# Patient Record
Sex: Female | Born: 1980 | Race: White | Hispanic: No | Marital: Single | Smoking: Never smoker
Health system: Southern US, Community
[De-identification: ages and names within clinical notes are randomized; demographics above are authoritative.]

---

## 2018-09-11 ENCOUNTER — Other Ambulatory Visit: Payer: Self-pay

## 2018-09-11 ENCOUNTER — Emergency Department
Admission: EM | Admit: 2018-09-11 | Discharge: 2018-09-11 | Disposition: A | Discharge status: Home / Self Care | Payer: Medicare PPO | Attending: Student in an Organized Health Care Education/Training Program | Admitting: Student in an Organized Health Care Education/Training Program

## 2018-09-11 ENCOUNTER — Emergency Department: Payer: Medicare PPO

## 2018-09-11 ENCOUNTER — Encounter: Payer: Self-pay | Admitting: Emergency Medicine

## 2018-09-11 DIAGNOSIS — N760 Acute vaginitis: Secondary | ICD-10-CM | POA: Insufficient documentation

## 2018-09-11 DIAGNOSIS — N739 Female pelvic inflammatory disease, unspecified: Secondary | ICD-10-CM | POA: Insufficient documentation

## 2018-09-11 DIAGNOSIS — K529 Noninfective gastroenteritis and colitis, unspecified: Secondary | ICD-10-CM | POA: Diagnosis not present

## 2018-09-11 DIAGNOSIS — N730 Acute parametritis and pelvic cellulitis: Secondary | ICD-10-CM

## 2018-09-11 DIAGNOSIS — E860 Dehydration: Secondary | ICD-10-CM | POA: Diagnosis not present

## 2018-09-11 DIAGNOSIS — B9689 Other specified bacterial agents as the cause of diseases classified elsewhere: Secondary | ICD-10-CM

## 2018-09-11 DIAGNOSIS — R102 Pelvic and perineal pain: Secondary | ICD-10-CM

## 2018-09-11 LAB — URINALYSIS, COMPLETE (UACMP) WITH MICROSCOPIC
Bacteria, UA: NONE SEEN
Bilirubin Urine: NEGATIVE
Glucose, UA: NEGATIVE mg/dL
Hgb urine dipstick: NEGATIVE
Ketones, ur: NEGATIVE mg/dL
LEUKOCYTE UA: NEGATIVE
Nitrite: NEGATIVE
Protein, ur: NEGATIVE mg/dL
Specific Gravity, Urine: 1.011 (ref 1.005–1.030)
pH: 7 (ref 5.0–8.0)

## 2018-09-11 LAB — CBC WITH DIFFERENTIAL/PLATELET
Abs Immature Granulocytes: 0.02 10*3/uL (ref 0.00–0.07)
Basophils Absolute: 0 10*3/uL (ref 0.0–0.1)
Basophils Relative: 0 %
EOS PCT: 1 %
Eosinophils Absolute: 0.1 10*3/uL (ref 0.0–0.5)
HCT: 42.4 % (ref 36.0–46.0)
Hemoglobin: 13.8 g/dL (ref 12.0–15.0)
Immature Granulocytes: 0 %
Lymphocytes Relative: 10 %
Lymphs Abs: 0.9 10*3/uL (ref 0.7–4.0)
MCH: 31.1 pg (ref 26.0–34.0)
MCHC: 32.5 g/dL (ref 30.0–36.0)
MCV: 95.5 fL (ref 80.0–100.0)
Monocytes Absolute: 0.4 10*3/uL (ref 0.1–1.0)
Monocytes Relative: 5 %
Neutro Abs: 6.9 10*3/uL (ref 1.7–7.7)
Neutrophils Relative %: 84 %
Platelets: 347 10*3/uL (ref 150–400)
RBC: 4.44 MIL/uL (ref 3.87–5.11)
RDW: 12.2 % (ref 11.5–15.5)
WBC: 8.3 10*3/uL (ref 4.0–10.5)
nRBC: 0 % (ref 0.0–0.2)

## 2018-09-11 LAB — WET PREP, GENITAL
Sperm: NONE SEEN
Trich, Wet Prep: NONE SEEN
Yeast Wet Prep HPF POC: NONE SEEN

## 2018-09-11 LAB — COMPREHENSIVE METABOLIC PANEL
ALT: 22 U/L (ref 0–44)
ANION GAP: 8 (ref 5–15)
AST: 21 U/L (ref 15–41)
Albumin: 4.1 g/dL (ref 3.5–5.0)
Alkaline Phosphatase: 44 U/L (ref 38–126)
BUN: 5 mg/dL — ABNORMAL LOW (ref 6–20)
CO2: 25 mmol/L (ref 22–32)
Calcium: 8.5 mg/dL — ABNORMAL LOW (ref 8.9–10.3)
Chloride: 103 mmol/L (ref 98–111)
Creatinine, Ser: 0.57 mg/dL (ref 0.44–1.00)
GFR calc Af Amer: >60 mL/min (ref >60)
GFR calc non Af Amer: >60 mL/min (ref >60)
Glucose, Bld: 134 mg/dL — ABNORMAL HIGH (ref 70–99)
Potassium: 3.5 mmol/L (ref 3.5–5.1)
Sodium: 136 mmol/L (ref 135–145)
Total Bilirubin: 0.4 mg/dL (ref 0.3–1.2)
Total Protein: 7.3 g/dL (ref 6.5–8.1)

## 2018-09-11 LAB — CHLAMYDIA/NGC RT PCR (ARMC ONLY)
Chlamydia Tr: NOT DETECTED
N gonorrhoeae: NOT DETECTED

## 2018-09-11 LAB — LACTIC ACID, PLASMA: LACTIC ACID, VENOUS: 1.3 mmol/L (ref 0.5–1.9)

## 2018-09-11 MED ORDER — IOHEXOL 300 MG/ML  SOLN
100.0000 mL | Freq: Once | INTRAMUSCULAR | Status: AC | PRN
  Administered 2018-09-11: 100 mL via INTRAVENOUS
  Filled 2018-09-11: qty 100

## 2018-09-11 MED ORDER — SODIUM CHLORIDE 0.9 % IV BOLUS
1000.0000 mL | Freq: Once | INTRAVENOUS | Status: AC
  Administered 2018-09-11: 1000 mL via INTRAVENOUS

## 2018-09-11 MED ORDER — PROMETHAZINE HCL 25 MG/ML IJ SOLN
25.0000 mg | Freq: Once | INTRAMUSCULAR | Status: AC
  Administered 2018-09-11: 25 mg via INTRAVENOUS
  Filled 2018-09-11: qty 1

## 2018-09-11 MED ORDER — DOXYCYCLINE HYCLATE 100 MG PO TABS: 100.0000 mg | ORAL_TABLET | Freq: Two times a day (BID) | ORAL | 0 refills | Status: AC

## 2018-09-11 MED ORDER — LIDOCAINE HCL (PF) 1 % IJ SOLN
2.1000 mL | Freq: Once | INTRAMUSCULAR | Status: AC
  Administered 2018-09-11: 2.1 mL
  Filled 2018-09-11: qty 5

## 2018-09-11 MED ORDER — METRONIDAZOLE 500 MG PO TABS: 500.0000 mg | ORAL_TABLET | Freq: Two times a day (BID) | ORAL | 0 refills | Status: AC

## 2018-09-11 MED ORDER — CEFTRIAXONE SODIUM 1 G IJ SOLR
1.0000 g | Freq: Once | INTRAMUSCULAR | Status: AC
  Administered 2018-09-11: 1 g via INTRAMUSCULAR
  Filled 2018-09-11: qty 10

## 2018-09-11 MED ORDER — IOPAMIDOL (ISOVUE-300) INJECTION 61%
30.0000 mL | Freq: Once | INTRAVENOUS | Status: AC | PRN
  Administered 2018-09-11: 30 mL via ORAL
  Filled 2018-09-11: qty 30

## 2018-09-11 MED ORDER — ONDANSETRON 4 MG PO TBDP: 4.0000 mg | ORAL_TABLET | Freq: Three times a day (TID) | ORAL | 0 refills | Status: DC | PRN

## 2018-09-11 NOTE — ED Notes (Signed)
Peripheral IV discontinued. Catheter intact. No signs of infiltration or redness. Gauze applied to IV site.   Discharge instructions reviewed with patient. Questions fielded by this RN. Patient verbalizes understanding of instructions. Patient discharged home in stable condition per JC, PA. No acute distress noted at time of discharge.

## 2018-09-11 NOTE — ED Provider Notes (Signed)
[facility] Emergency Department Provider Note  ____________________________________________  Time seen: Approximately 6:23 PM  I have reviewed the triage vital signs and the nursing notes.   HISTORY  Chief Complaint Foreign Body in Vagina    HPI Doris Coleman is a 38 y.o. female who presents the emergency department for complaint sharp pelvic pain, fever, diarrhea.  Patient presented to the emergency department with complaints of excruciating vaginal and pelvic pain.  Patient had a NuvaRing placed 5 days ago and states that since then she has had worsening symptoms.  This is not due for removal but states patient states that she needs it removed.  She attempted removal at home and states that she could not find same.  Patient denies any vaginal bleeding or discharge.  She denies any dysuria, polyuria, hematuria.  No flank pain.  No upper abdominal pain.  No right lower quadrant or left lower quadrant she describes it as a pelvic/vaginal pain.  No history of STDs or PID.  Patient has been taking both Tylenol and Motrin for this complaint.        History reviewed. No pertinent past medical history.  There are no active problems to display for this patient.   History reviewed. No pertinent surgical history.  Prior to Admission medications   Medication Sig Start Date End Date Taking? Authorizing Provider  doxycycline (VIBRA-TABS) 100 MG tablet Take 1 tablet (100 mg total) by mouth 2 (two) times daily for 14 days. 09/11/18 09/25/18  Cuthriell, Delorise Royals, PA-C  metroNIDAZOLE (FLAGYL) 500 MG tablet Take 1 tablet (500 mg total) by mouth 2 (two) times daily for 14 days. 09/11/18 09/25/18  Cuthriell, Delorise Royals, PA-C  ondansetron (ZOFRAN-ODT) 4 MG disintegrating tablet Take 1 tablet (4 mg total) by mouth every 8 (eight) hours as needed for nausea or vomiting. 09/11/18   Cuthriell, Delorise Royals, PA-C    Allergies Patient has no known allergies.  History reviewed. No  pertinent family history.  Social History Social History   Tobacco Use  . Smoking status: Never Smoker  . Smokeless tobacco: Never Used  Substance Use Topics  . Alcohol use: Never    Frequency: Never  . Drug use: Never     Review of Systems  Constitutional: No fever/chills Eyes: No visual changes.  Cardiovascular: no chest pain. Respiratory: no cough. No SOB. Gastrointestinal: Sharp pelvic and vaginal pain.  No nausea, no vomiting.  No diarrhea.  No constipation. Genitourinary: Negative for dysuria. No hematuria.  Positive for retained foreign body in the vagina.  Positive for vaginal pain.  No discharge or vaginal bleeding. Musculoskeletal: Negative for musculoskeletal pain. Skin: Negative for rash, abrasions, lacerations, ecchymosis. Neurological: Negative for headaches, focal weakness or numbness. 10-point ROS otherwise negative.  ____________________________________________   PHYSICAL EXAM:  VITAL SIGNS: ED Triage Vitals  Enc Vitals Group     BP 09/11/18 1813 138/84     Pulse Rate 09/11/18 1811 92     Resp 09/11/18 1811 18     Temp 09/11/18 1811 98.3 F (36.8 C)     Temp Source 09/11/18 1811 Oral     SpO2 09/11/18 1811 98 %     Weight 09/11/18 1812 215 lb (97.5 kg)     Height 09/11/18 1812  (1.651 m)     Head Circumference --      Peak Flow --      Pain Score 09/11/18 1812 0     Pain Loc --      Pain  Edu? --      Excl. in GC? --      Constitutional: Alert and oriented. Well appearing and in no acute distress. Eyes: Conjunctivae are normal. PERRL. EOMI. Head: Atraumatic. Neck: No stridor.    Cardiovascular: Normal rate, regular rhythm. Normal S1 and S2.  Good peripheral circulation. Respiratory: Normal respiratory effort without tachypnea or retractions. Lungs CTAB. Good air entry to the bases with no decreased or absent breath sounds. Gastrointestinal: Bowel sounds 4 quadrants. Soft and nontender to palpation. No guarding or rigidity. No palpable  masses. No distention. No CVA tenderness. Genitourinary: Visualization of external genitalia reveals no visible abnormality.  Patient was barely able to tolerate speculum exam due to pain.  No lesions or tears noted along the vaginal wall.  Patient was unable to tolerate opening of the speculum to the first notch.  Cervix was identified and it was erythematous, edematous.  No evidence of Nuvaring on initial exam.  Using bimanual exam, patient has significant cervical motion tenderness.  Palpation of the NuvaRing was appreciated and this was successfully removed.  No palpable masses or lesions on bimanual exam. Musculoskeletal: Full range of motion to all extremities. No gross deformities appreciated. Neurologic:  Normal speech and language. No gross focal neurologic deficits are appreciated.  Skin:  Skin is warm, dry and intact. No rash noted. Psychiatric: Mood and affect are normal. Speech and behavior are normal. Patient exhibits appropriate insight and judgement.  Female pelvic exam is performed with female ED tech chaperone. ____________________________________________   LABS (all labs ordered are listed, but only abnormal results are displayed)  Labs Reviewed  WET PREP, GENITAL - Abnormal; Notable for the following components:      Result Value   Clue Cells Wet Prep HPF POC PRESENT (*)    WBC, Wet Prep HPF POC MANY (*)    All other components within normal limits  COMPREHENSIVE METABOLIC PANEL - Abnormal; Notable for the following components:   Glucose, Bld 134 (*)    BUN 5 (*)    Calcium 8.5 (*)    All other components within normal limits  CHLAMYDIA/NGC RT PCR ([facility] ONLY)  CULTURE, BLOOD (ROUTINE X 2)  CULTURE, BLOOD (ROUTINE X 2)  CBC WITH DIFFERENTIAL/PLATELET  LACTIC ACID, PLASMA  URINALYSIS, COMPLETE (UACMP) WITH MICROSCOPIC  LACTIC ACID, PLASMA  POC URINE PREG, ED    ____________________________________________  EKG   ____________________________________________  RADIOLOGY   Ct Abdomen Pelvis W Contrast  Result Date: 09/11/2018 CLINICAL DATA:  Diffuse, nonfocal abdominal pain. Pelvic pain, fever and diarrhea. Nausea and vomiting. EXAM: CT ABDOMEN AND PELVIS WITH CONTRAST TECHNIQUE: Multidetector CT imaging of the abdomen and pelvis was performed using the standard protocol following bolus administration of intravenous contrast. CONTRAST:  OMNIPAQUE IOHEXOL 300 MG/ML  SOLN COMPARISON:  None. FINDINGS: Lower chest: Unremarkable. Hepatobiliary: Diffuse low density of the liver relative to the spleen. Normal appearing gallbladder. Pancreas: Unremarkable. No pancreatic ductal dilatation or surrounding inflammatory changes. Spleen: Normal in size without focal abnormality. Adrenals/Urinary Tract: Adrenal glands are unremarkable. Kidneys are normal, without renal calculi, focal lesion, or hydronephrosis. Bladder is unremarkable. Stomach/Bowel: Stomach is within normal limits. Appendix appears normal. Mild diffuse wall thickening involving the majority of the small bowel loops. Normal appearing colon. Vascular/Lymphatic: No significant vascular findings are present. No enlarged abdominal or pelvic lymph nodes. Reproductive: Uterus and bilateral adnexa are unremarkable. Other: Small umbilical hernia containing fat. Musculoskeletal: Mild lumbar and lower thoracic spine degenerative changes. IMPRESSION: 1. Mild diffuse wall thickening involving  the majority of the small bowel loops, compatible with enteritis. 2. Diffuse hepatic steatosis. Electronically Signed   By: Beckie Salts M.D.   On: 09/11/2018 21:00    ____________________________________________    PROCEDURES  Procedure(s) performed:    Procedures    Medications  cefTRIAXone (ROCEPHIN) injection 1 g (has no administration in time range)  lidocaine (PF) (XYLOCAINE) 1 % injection 2.1 mL (has no  administration in time range)  sodium chloride 0.9 % bolus 1,000 mL (1,000 mLs Intravenous Bolus from Bag 09/11/18 1909)  promethazine (PHENERGAN) injection 25 mg (25 mg Intravenous Given 09/11/18 1933)  iopamidol (ISOVUE-300) 61 % injection 30 mL (30 mLs Oral Contrast Given 09/11/18 1922)  iohexol (OMNIPAQUE) 300 MG/ML solution 100 mL (100 mLs Intravenous Contrast Given 09/11/18 2044)     ____________________________________________   INITIAL IMPRESSION / ASSESSMENT AND PLAN / ED COURSE  Pertinent labs & imaging results that were available during my care of the patient were reviewed by me and considered in my medical decision making (see chart for details).  Review of the [place] CSRS was performed in accordance of the [facility] prior to dispensing any controlled drugs.           Patient's diagnosis is consistent with pelvic inflammatory disease, BV, enteritis.  Patient presents emergency department with multiple complaints.  Initially patient's main complaint was significant pelvic pain, retained NuvaRing.  While she was here, patient endorsed increasing abdominal symptoms.  She has had diarrhea at home and is now experiencing nausea and vomiting.  Given patient's symptoms she was evaluated with labs, imaging.  Patient does have findings consistent with BV on wet prep, otherwise labs are reassuring.  Patient does have findings consistent with enteritis on CT.  With significant vaginal tenderness on exam and cervical motion tenderness.  Patient will be treated with Rocephin in the emergency department, doxycycline and Flagyl outpatient.  Patient is provided prescription for Zofran for nausea and vomiting.  I discussed the use of probiotics for both the enteritis as well as to limit potential side effects of antibiotic use.  Patient verbalizes understanding of her diagnosis.  She will follow-up with OB/GYN..  Patient is given ED precautions to return to the ED for any worsening or new  symptoms.     ____________________________________________  FINAL CLINICAL IMPRESSION(S) / ED DIAGNOSES  Final diagnoses:  Pelvic pain  PID (acute pelvic inflammatory disease)  BV (bacterial vaginosis)  Enteritis      NEW MEDICATIONS STARTED DURING THIS VISIT:  ED Discharge Orders         Ordered    metroNIDAZOLE (FLAGYL) 500 MG tablet  2 times daily     09/11/18 2126    doxycycline (VIBRA-TABS) 100 MG tablet  2 times daily     09/11/18 2126    ondansetron (ZOFRAN-ODT) 4 MG disintegrating tablet  Every 8 hours PRN     09/11/18 2126              This chart was dictated using voice recognition software/Dragon. Despite best efforts to proofread, errors can occur which can change the meaning. Any change was purely unintentional.    Racheal Patches, PA-C 09/11/18 2148    Willy Eddy, MD 09/12/18 1515

## 2018-09-11 NOTE — ED Notes (Signed)
Patient transported to CT 

## 2018-09-11 NOTE — ED Notes (Signed)
CT at bedside with contrast drink

## 2018-09-11 NOTE — ED Triage Notes (Addendum)
Pt here because cannot get her nuvaring out.  Has been in since the 10th of this month and not tolerating but cannot get it out.  Does not have OBGYN here.  Sister is registered [employer] per pt mom and told her to come to ED because can get toxic shock. Explained nuva ring is made to be in for 3 weeks so toxic shock is not something would see typically after it being in for 5 days.  VSS. Pt ambulatory. Would like help getting it out.

## 2018-09-11 NOTE — ED Notes (Signed)
Patient reports she was here to get nuva ring taken out that was put in on 09/06/2018

## 2018-09-11 NOTE — ED Notes (Signed)
Patient has finished both bottles of oral contrast. CT tech aware.

## 2018-09-16 LAB — CULTURE, BLOOD (ROUTINE X 2)
Culture: NO GROWTH
Culture: NO GROWTH
Special Requests: ADEQUATE

## 2020-02-27 IMAGING — CT CT ABDOMEN AND PELVIS WITH CONTRAST
2 of 4 series · 16 of 46 positions shown, 18 images · IV contrast (APPLIED)
Comparison: None.

CLINICAL DATA: Diffuse, nonfocal abdominal pain. Pelvic pain, fever
and diarrhea. Nausea and vomiting.

EXAM:
CT ABDOMEN AND PELVIS WITH CONTRAST
TECHNIQUE: Multidetector CT imaging of the abdomen and pelvis was performed
using the standard protocol following bolus administration of
intravenous contrast.
CONTRAST:  100mL OMNIPAQUE IOHEXOL 300 MG/ML  SOLN

[Series 2: routine abd/pel with · axial · 0.72mm/px · z∈[-368,+97]mm · 13 of 103 slices shown, 15 images]
[im 5/103  soft-tissue]
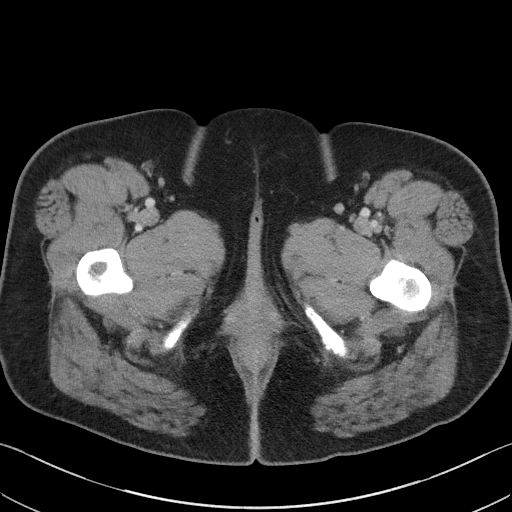
[im 5/103  bone]
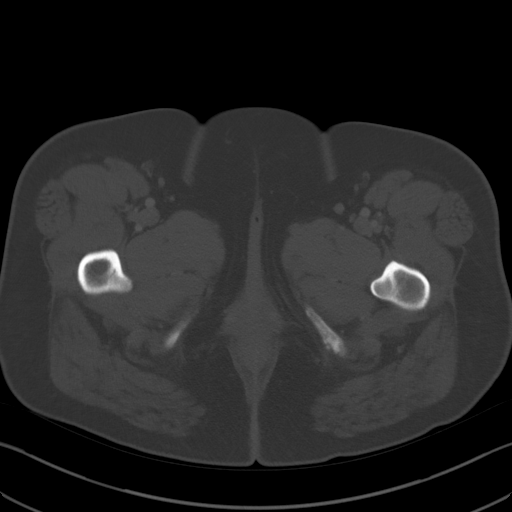
[im 14/103  soft-tissue]
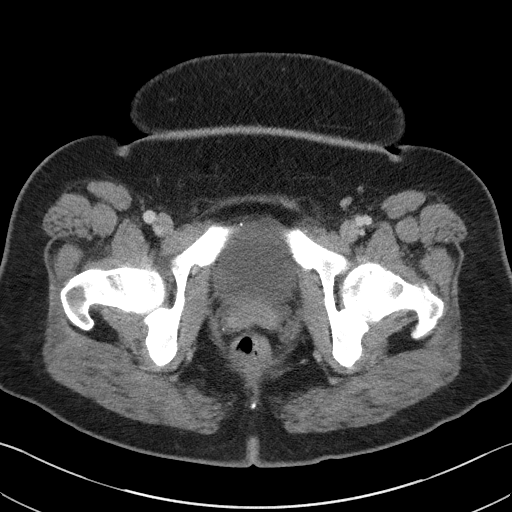
[im 23/103  soft-tissue]
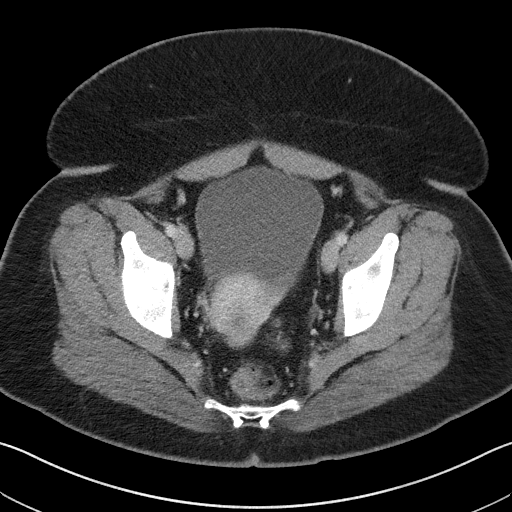
[im 27/103  soft-tissue]
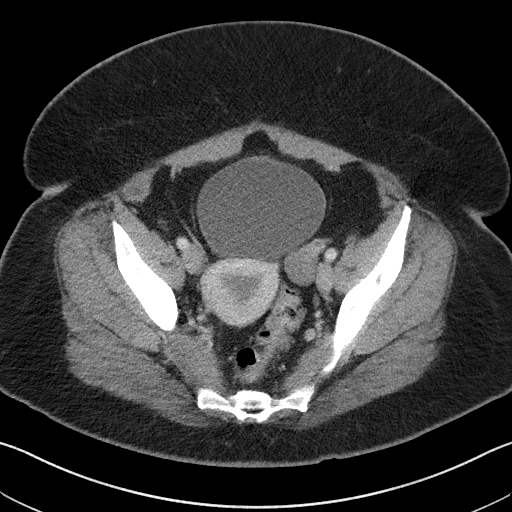
[im 36/103  soft-tissue]
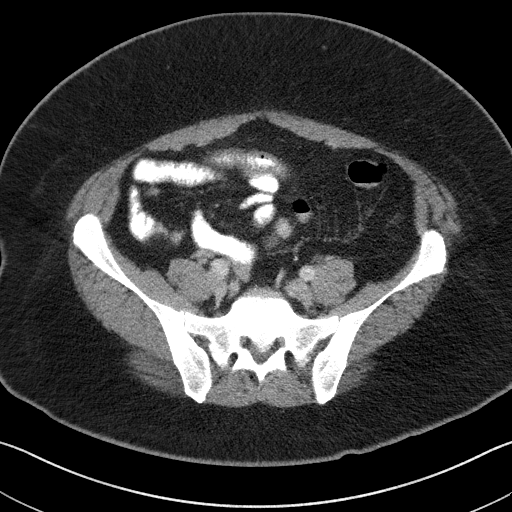
[im 45/103  soft-tissue]
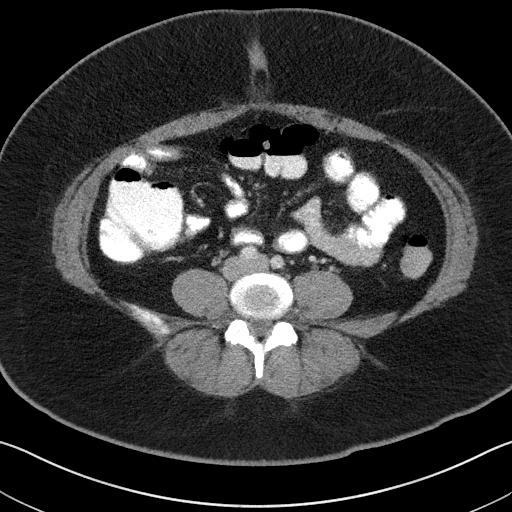
[im 54/103  soft-tissue]
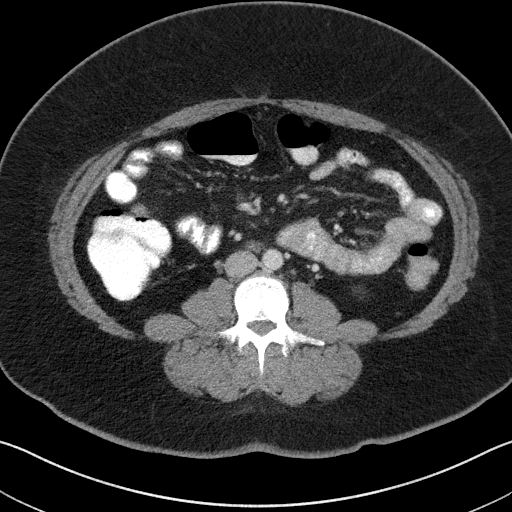
[im 58/103  soft-tissue]
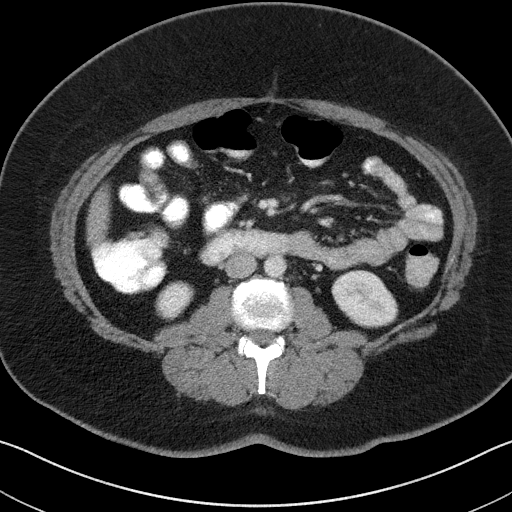
[im 67/103  soft-tissue]
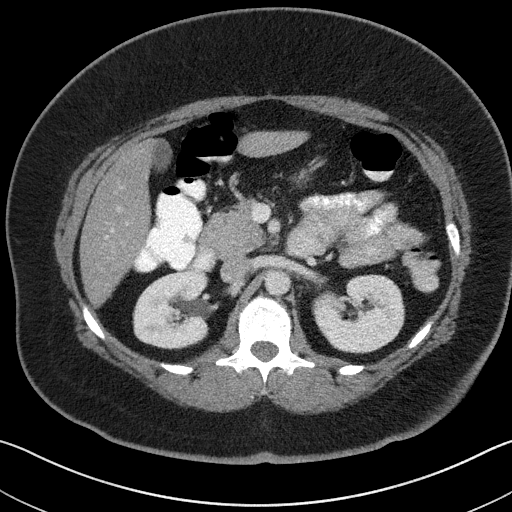
[im 67/103  bone]
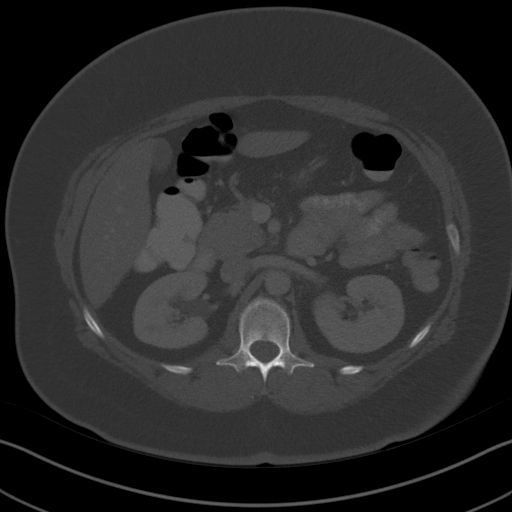
[im 76/103  soft-tissue]
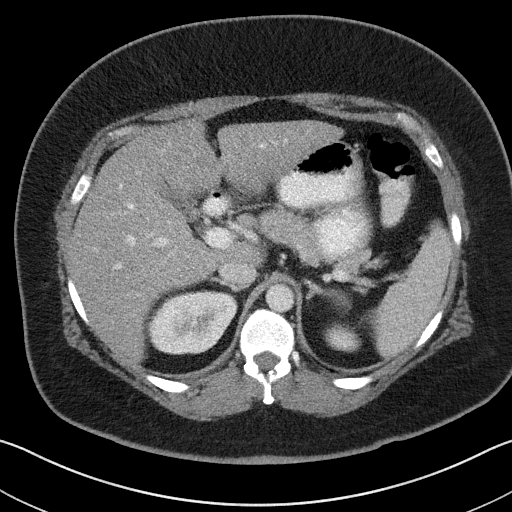
[im 80/103  soft-tissue]
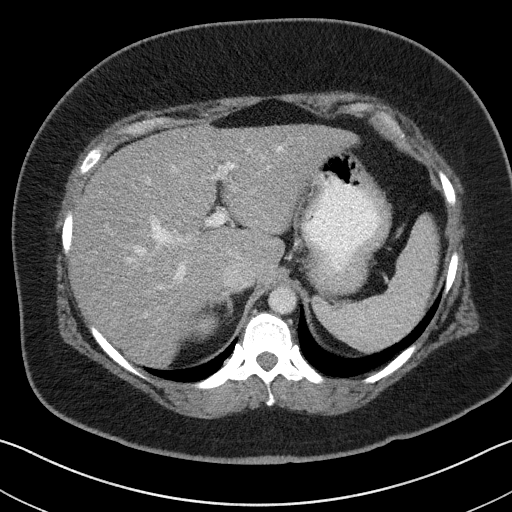
[im 89/103  soft-tissue]
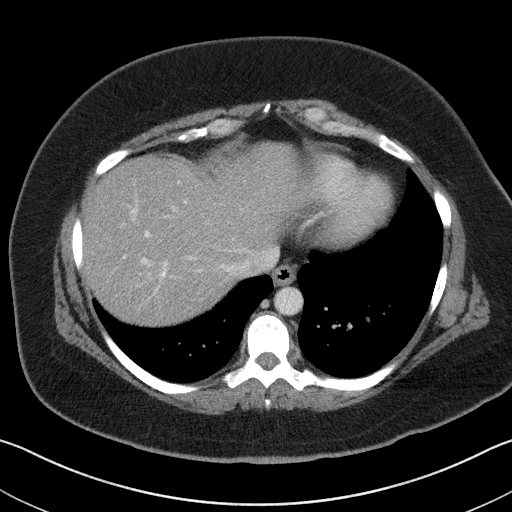
[im 98/103  soft-tissue]
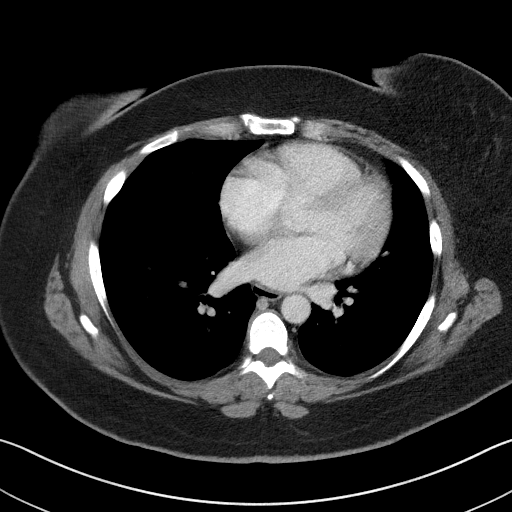

[Series 5: coronal st · coronal · 0.81mm/px · 3 of 109 slices shown]
[im 37/109  soft-tissue]
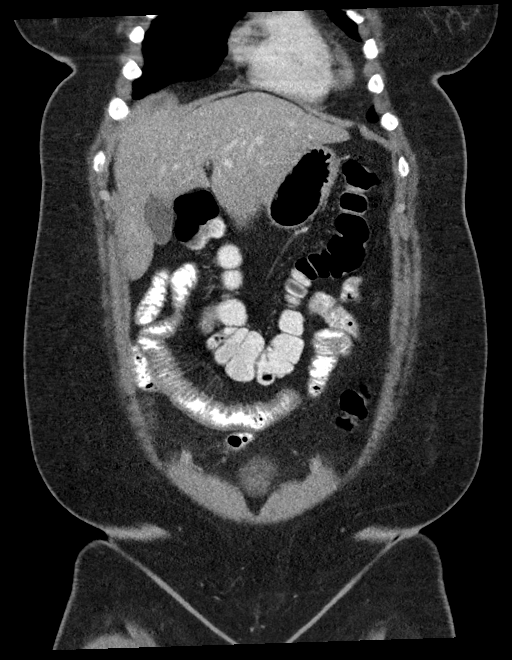
[im 49/109  soft-tissue]
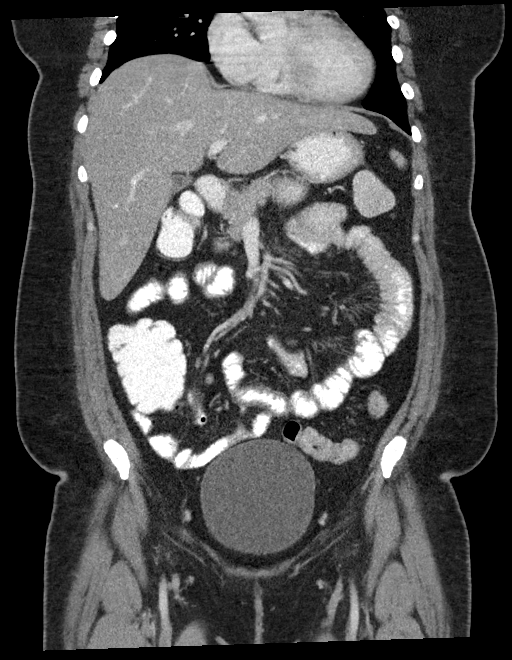
[im 61/109  soft-tissue]
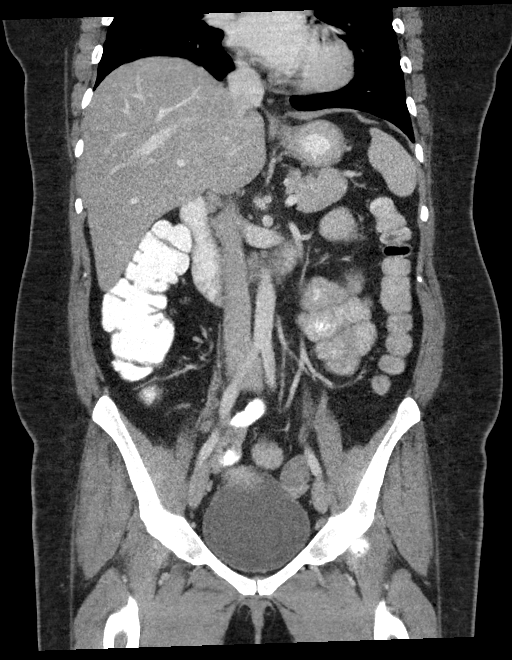

[16 of 46 positions shown; findings below may reference images not displayed]

FINDINGS: Lower chest: Unremarkable.

Hepatobiliary: Diffuse low density of the liver relative to the
spleen. Normal appearing gallbladder.

Pancreas: Unremarkable. No pancreatic ductal dilatation or
surrounding inflammatory changes.

Spleen: Normal in size without focal abnormality.

Adrenals/Urinary Tract: Adrenal glands are unremarkable. Kidneys are
normal, without renal calculi, focal lesion, or hydronephrosis.
Bladder is unremarkable.

Stomach/Bowel: Stomach is within normal limits. Appendix appears
normal. Mild diffuse wall thickening involving the majority of the
small bowel loops. Normal appearing colon.

Vascular/Lymphatic: No significant vascular findings are present. No
enlarged abdominal or pelvic lymph nodes.

Reproductive: Uterus and bilateral adnexa are unremarkable.

Other: Small umbilical hernia containing fat.

Musculoskeletal: Mild lumbar and lower thoracic spine degenerative
changes.
IMPRESSION: 1. Mild diffuse wall thickening involving the majority of the small
bowel loops, compatible with enteritis.
2. Diffuse hepatic steatosis.

## 2020-10-21 DIAGNOSIS — M62830 Muscle spasm of back: Secondary | ICD-10-CM | POA: Diagnosis not present

## 2020-10-24 DIAGNOSIS — M545 Low back pain, unspecified: Secondary | ICD-10-CM | POA: Insufficient documentation

## 2020-11-18 DIAGNOSIS — M545 Low back pain, unspecified: Secondary | ICD-10-CM | POA: Diagnosis not present

## 2020-11-18 DIAGNOSIS — M5412 Radiculopathy, cervical region: Secondary | ICD-10-CM | POA: Diagnosis not present

## 2020-11-22 DIAGNOSIS — M545 Low back pain, unspecified: Secondary | ICD-10-CM | POA: Diagnosis not present

## 2020-11-25 DIAGNOSIS — M5412 Radiculopathy, cervical region: Secondary | ICD-10-CM | POA: Diagnosis not present

## 2020-12-05 DIAGNOSIS — M545 Low back pain, unspecified: Secondary | ICD-10-CM | POA: Diagnosis not present

## 2020-12-20 DIAGNOSIS — M5030 Other cervical disc degeneration, unspecified cervical region: Secondary | ICD-10-CM | POA: Diagnosis not present

## 2021-06-16 DIAGNOSIS — Z20822 Contact with and (suspected) exposure to covid-19: Secondary | ICD-10-CM | POA: Diagnosis not present

## 2021-06-16 DIAGNOSIS — J069 Acute upper respiratory infection, unspecified: Secondary | ICD-10-CM | POA: Diagnosis not present

## 2021-06-17 DIAGNOSIS — J069 Acute upper respiratory infection, unspecified: Secondary | ICD-10-CM | POA: Diagnosis not present

## 2021-06-17 DIAGNOSIS — R69 Illness, unspecified: Secondary | ICD-10-CM | POA: Diagnosis not present

## 2021-11-24 DIAGNOSIS — R5383 Other fatigue: Secondary | ICD-10-CM | POA: Diagnosis not present

## 2021-11-24 DIAGNOSIS — R69 Illness, unspecified: Secondary | ICD-10-CM | POA: Diagnosis not present

## 2021-11-24 DIAGNOSIS — R7303 Prediabetes: Secondary | ICD-10-CM | POA: Diagnosis not present

## 2021-11-24 DIAGNOSIS — E559 Vitamin D deficiency, unspecified: Secondary | ICD-10-CM | POA: Diagnosis not present

## 2021-11-24 DIAGNOSIS — E782 Mixed hyperlipidemia: Secondary | ICD-10-CM | POA: Diagnosis not present

## 2021-11-24 DIAGNOSIS — N911 Secondary amenorrhea: Secondary | ICD-10-CM | POA: Diagnosis not present

## 2022-01-09 DIAGNOSIS — R69 Illness, unspecified: Secondary | ICD-10-CM | POA: Diagnosis not present

## 2022-01-09 DIAGNOSIS — E559 Vitamin D deficiency, unspecified: Secondary | ICD-10-CM | POA: Diagnosis not present

## 2022-01-09 DIAGNOSIS — R5383 Other fatigue: Secondary | ICD-10-CM | POA: Diagnosis not present

## 2022-01-09 DIAGNOSIS — E782 Mixed hyperlipidemia: Secondary | ICD-10-CM | POA: Diagnosis not present

## 2022-01-09 DIAGNOSIS — R7303 Prediabetes: Secondary | ICD-10-CM | POA: Diagnosis not present

## 2022-01-09 DIAGNOSIS — N911 Secondary amenorrhea: Secondary | ICD-10-CM | POA: Diagnosis not present

## 2022-01-09 DIAGNOSIS — F32A Depression, unspecified: Secondary | ICD-10-CM | POA: Diagnosis not present

## 2022-01-23 DIAGNOSIS — E1165 Type 2 diabetes mellitus with hyperglycemia: Secondary | ICD-10-CM | POA: Diagnosis not present

## 2022-01-23 DIAGNOSIS — I10 Essential (primary) hypertension: Secondary | ICD-10-CM | POA: Diagnosis not present

## 2022-01-23 DIAGNOSIS — E782 Mixed hyperlipidemia: Secondary | ICD-10-CM | POA: Diagnosis not present

## 2022-01-23 DIAGNOSIS — R69 Illness, unspecified: Secondary | ICD-10-CM | POA: Diagnosis not present

## 2022-02-23 DIAGNOSIS — E1165 Type 2 diabetes mellitus with hyperglycemia: Secondary | ICD-10-CM | POA: Diagnosis not present

## 2022-02-23 DIAGNOSIS — R69 Illness, unspecified: Secondary | ICD-10-CM | POA: Diagnosis not present

## 2022-02-23 DIAGNOSIS — I10 Essential (primary) hypertension: Secondary | ICD-10-CM | POA: Diagnosis not present

## 2022-02-23 DIAGNOSIS — E782 Mixed hyperlipidemia: Secondary | ICD-10-CM | POA: Diagnosis not present

## 2022-03-02 DIAGNOSIS — S29012A Strain of muscle and tendon of back wall of thorax, initial encounter: Secondary | ICD-10-CM | POA: Diagnosis not present

## 2022-03-02 DIAGNOSIS — M549 Dorsalgia, unspecified: Secondary | ICD-10-CM | POA: Diagnosis not present

## 2022-04-24 DIAGNOSIS — Z0131 Encounter for examination of blood pressure with abnormal findings: Secondary | ICD-10-CM | POA: Diagnosis not present

## 2022-04-24 DIAGNOSIS — E1165 Type 2 diabetes mellitus with hyperglycemia: Secondary | ICD-10-CM | POA: Diagnosis not present

## 2022-04-24 DIAGNOSIS — Z0000 Encounter for general adult medical examination without abnormal findings: Secondary | ICD-10-CM | POA: Diagnosis not present

## 2022-04-24 DIAGNOSIS — R5383 Other fatigue: Secondary | ICD-10-CM | POA: Diagnosis not present

## 2022-04-24 DIAGNOSIS — E782 Mixed hyperlipidemia: Secondary | ICD-10-CM | POA: Diagnosis not present

## 2022-04-24 DIAGNOSIS — Z1231 Encounter for screening mammogram for malignant neoplasm of breast: Secondary | ICD-10-CM | POA: Diagnosis not present

## 2022-05-05 ENCOUNTER — Other Ambulatory Visit: Payer: Self-pay | Admitting: Family

## 2022-05-05 DIAGNOSIS — Z1231 Encounter for screening mammogram for malignant neoplasm of breast: Secondary | ICD-10-CM

## 2022-06-18 DIAGNOSIS — R69 Illness, unspecified: Secondary | ICD-10-CM | POA: Diagnosis not present

## 2022-06-18 DIAGNOSIS — R42 Dizziness and giddiness: Secondary | ICD-10-CM | POA: Diagnosis not present

## 2022-06-18 DIAGNOSIS — E782 Mixed hyperlipidemia: Secondary | ICD-10-CM | POA: Diagnosis not present

## 2022-06-18 DIAGNOSIS — E1165 Type 2 diabetes mellitus with hyperglycemia: Secondary | ICD-10-CM | POA: Diagnosis not present

## 2022-07-24 DIAGNOSIS — E1165 Type 2 diabetes mellitus with hyperglycemia: Secondary | ICD-10-CM | POA: Diagnosis not present

## 2022-07-24 DIAGNOSIS — E669 Obesity, unspecified: Secondary | ICD-10-CM | POA: Diagnosis not present

## 2022-07-24 DIAGNOSIS — I10 Essential (primary) hypertension: Secondary | ICD-10-CM | POA: Diagnosis not present

## 2022-07-24 DIAGNOSIS — E782 Mixed hyperlipidemia: Secondary | ICD-10-CM | POA: Diagnosis not present

## 2022-08-11 ENCOUNTER — Ambulatory Visit: Payer: Medicare HMO

## 2022-08-11 ENCOUNTER — Ambulatory Visit: Payer: Self-pay

## 2022-08-11 ENCOUNTER — Telehealth: Payer: Self-pay

## 2022-08-11 NOTE — Telephone Encounter (Signed)
Pt's covid test came back all negative, asked if you can send rx abx for her? Said it's like a cold, congestion, cough no fever though please advise

## 2022-08-13 MED ORDER — AMOXICILLIN-POT CLAVULANATE 875-125 MG PO TABS: 1.0000 | ORAL_TABLET | Freq: Two times a day (BID) | ORAL | 0 refills | Status: DC

## 2022-08-13 NOTE — Addendum Note (Signed)
Addended by: Georgian Co on: 08/13/2022 07:41 PM   Modules accepted: Orders

## 2022-08-31 ENCOUNTER — Telehealth: Payer: Self-pay

## 2022-08-31 DIAGNOSIS — E1165 Type 2 diabetes mellitus with hyperglycemia: Secondary | ICD-10-CM

## 2022-08-31 NOTE — Telephone Encounter (Signed)
Pt called and left vm requesting refill on rx mounjaro, I didn't know which dose she is on looked in old system as well. Please advise

## 2022-09-04 ENCOUNTER — Other Ambulatory Visit: Payer: Self-pay

## 2022-09-06 MED ORDER — MOUNJARO 2.5 MG/0.5ML ~~LOC~~ SOAJ: 2.5000 mg | SUBCUTANEOUS | 1 refills | Status: DC

## 2022-09-17 MED ORDER — MOUNJARO 5 MG/0.5ML ~~LOC~~ SOAJ: 5.0000 mg | SUBCUTANEOUS | 2 refills | Status: DC

## 2022-09-17 NOTE — Addendum Note (Signed)
Addended by: Georgian Co on: 09/17/2022 03:11 PM   Modules accepted: Orders

## 2022-09-18 ENCOUNTER — Ambulatory Visit (INDEPENDENT_AMBULATORY_CARE_PROVIDER_SITE_OTHER): Payer: Medicare HMO | Admitting: Family

## 2022-09-18 VITALS — BP 120/72 | HR 88 | Ht 65.0 in | Wt 213.0 lb

## 2022-09-18 DIAGNOSIS — E538 Deficiency of other specified B group vitamins: Secondary | ICD-10-CM | POA: Diagnosis not present

## 2022-09-18 DIAGNOSIS — I10 Essential (primary) hypertension: Secondary | ICD-10-CM | POA: Diagnosis not present

## 2022-09-18 DIAGNOSIS — E1165 Type 2 diabetes mellitus with hyperglycemia: Secondary | ICD-10-CM

## 2022-09-18 DIAGNOSIS — E039 Hypothyroidism, unspecified: Secondary | ICD-10-CM | POA: Diagnosis not present

## 2022-09-18 DIAGNOSIS — F3341 Major depressive disorder, recurrent, in partial remission: Secondary | ICD-10-CM

## 2022-09-18 DIAGNOSIS — E782 Mixed hyperlipidemia: Secondary | ICD-10-CM

## 2022-09-18 DIAGNOSIS — E559 Vitamin D deficiency, unspecified: Secondary | ICD-10-CM | POA: Diagnosis not present

## 2022-09-18 LAB — POCT CBG (FASTING - GLUCOSE)-MANUAL ENTRY: Glucose Fasting, POC: 192 mg/dL — AB (ref 70–99)

## 2022-09-18 NOTE — Progress Notes (Signed)
Established Patient Office Visit  Subjective:  Patient ID: Doris Coleman, female    DOB: 04/25/1981  Age: 42 y.o. MRN: [id number]  Chief Complaint  Patient presents with   Follow-up    2 month follow up    Patient is here today for her 3 months follow up.  She has been feeling fairly well since last appointment.   She does not have additional concerns to discuss today.  Labs are due today. She needs refills.   I have reviewed her active problem list, medication list, allergies, family history, notes from last encounter, and lab results for her appointment today.   No other concerns at this time.   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  Social History   Socioeconomic History   Marital status: Single    Spouse name: Not on file   Number of children: Not on file   Years of education: Not on file   Highest education level: Not on file  Occupational History   Not on file  Tobacco Use   Smoking status: Never   Smokeless tobacco: Never  Substance and Sexual Activity   Alcohol use: Never   Drug use: Never   Sexual activity: Not on file  Other Topics Concern   Not on file  Social History Narrative   Not on file   Social Determinants of Health   Financial Resource Strain: Not on file  Food Insecurity: Not on file  Transportation Needs: Not on file  Physical Activity: Not on file  Stress: Not on file  Social Connections: Not on file  Intimate Partner Violence: Not on file    Family History  Problem Relation Age of Onset   Hyperlipidemia Mother    Hypertension Mother     Allergies  Allergen Reactions   Latex Hives and Rash    Review of Systems  All other systems reviewed and are negative.      Objective:   BP 120/72   Pulse 88   Ht 5\' 5"  (1.651 m)   Wt 213 lb (96.6 kg)   SpO2 97%    BMI 35.45 kg/m   Vitals:   09/18/22 1110  BP: 120/72  Pulse: 88  Height: 5\' 5"  (1.651 m)  Weight: 213 lb (96.6 kg)  SpO2: 97%  BMI (Calculated): 35.44    Physical Exam Vitals and nursing note reviewed.  Constitutional:      Appearance: Normal appearance. She is normal weight.  HENT:     Head: Normocephalic.  Eyes:     Extraocular Movements: Extraocular movements intact.     Conjunctiva/sclera: Conjunctivae normal.     Pupils: Pupils are equal, round, and reactive to light.  Cardiovascular:     Rate and Rhythm: Normal rate.  Pulmonary:     Effort: Pulmonary effort is normal.  Neurological:     General: No focal deficit present.     Mental Status: She is alert and oriented to person, place, and time. Mental status is at baseline.  Psychiatric:        Mood and Affect: Mood normal.        Behavior: Behavior normal.        Thought Content: Thought content normal.        Judgment: Judgment normal.      Results for orders placed or performed in  visit on 09/18/22  Lipid panel  Result Value Ref Range   Cholesterol, Total 187 100 - 199 mg/dL   Triglycerides 232 (H) 0 - 149 mg/dL   HDL 31 (L) >39 mg/dL   VLDL Cholesterol Cal 41 (H) 5 - 40 mg/dL   LDL Chol Calc (NIH) 115 (H) 0 - 99 mg/dL   Chol/HDL Ratio 6.0 (H) 0.0 - 4.4 ratio  VITAMIN D 25 Hydroxy (Vit-D Deficiency, Fractures)  Result Value Ref Range   Vit D, 25-Hydroxy 28.5 (L) 30.0 - 100.0 ng/mL  CBC With Differential  Result Value Ref Range   WBC 7.6 3.4 - 10.8 x10E3/uL   RBC 4.78 3.77 - 5.28 x10E6/uL   Hemoglobin 14.2 11.1 - 15.9 g/dL   Hematocrit 44.5 34.0 - 46.6 %   MCV 93 79 - 97 fL   MCH 29.7 26.6 - 33.0 pg   MCHC 31.9 31.5 - 35.7 g/dL   RDW 13.3 11.7 - 15.4 %   Neutrophils 55 Not Estab. %   Lymphs 35 Not Estab. %   Monocytes 7 Not Estab. %   Eos 2 Not Estab. %   Basos 1 Not Estab. %   Neutrophils Absolute 4.1 1.4 - 7.0 x10E3/uL   Lymphocytes Absolute 2.6 0.7 - 3.1 x10E3/uL   Monocytes Absolute 0.6  0.1 - 0.9 x10E3/uL   EOS (ABSOLUTE) 0.1 0.0 - 0.4 x10E3/uL   Basophils Absolute 0.1 0.0 - 0.2 x10E3/uL   Immature Granulocytes 0 Not Estab. %   Immature Grans (Abs) 0.0 0.0 - 0.1 x10E3/uL  CMP14+EGFR  Result Value Ref Range   Glucose 120 (H) 70 - 99 mg/dL   BUN 9 6 - 24 mg/dL   Creatinine, Ser 0.73 0.57 - 1.00 mg/dL   eGFR 106 >59 mL/min/1.73   BUN/Creatinine Ratio 12 9 - 23   Sodium 140 134 - 144 mmol/L   Potassium 4.6 3.5 - 5.2 mmol/L   Chloride 101 96 - 106 mmol/L   CO2 23 20 - 29 mmol/L   Calcium 9.6 8.7 - 10.2 mg/dL   Total Protein 7.2 6.0 - 8.5 g/dL   Albumin 4.1 3.9 - 4.9 g/dL   Globulin, Total 3.1 1.5 - 4.5 g/dL   Albumin/Globulin Ratio 1.3 1.2 - 2.2   Bilirubin Total 0.4 0.0 - 1.2 mg/dL   Alkaline Phosphatase 84 44 - 121 IU/L   AST 19 0 - 40 IU/L   ALT 23 0 - 32 IU/L  TSH  Result Value Ref Range   TSH 1.750 0.450 - 4.500 uIU/mL  Hemoglobin A1c  Result Value Ref Range   Hgb A1c MFr Bld 6.4 (H) 4.8 - 5.6 %   Est. average glucose Bld gHb Est-mCnc 137 mg/dL  Vitamin B12  Result Value Ref Range   Vitamin B-12 688 232 - 1,245 pg/mL  POCT CBG (Fasting - Glucose)  Result Value Ref Range   Glucose Fasting, POC 192 (A) 70 - 99 mg/dL    Recent Results (from the past 2160 hour(s))  POCT CBG (Fasting - Glucose)     Status: Abnormal   Collection Time: 09/18/22 11:19 AM  Result Value Ref Range   Glucose Fasting, POC 192 (A) 70 - 99 mg/dL    Comment: non-fasting  Lipid panel     Status: Abnormal   Collection Time: 09/18/22 11:57 AM  Result Value Ref Range   Cholesterol, Total 187 100 - 199 mg/dL   Triglycerides 232 (H) 0 - 149 mg/dL   HDL 31 (L) >39 mg/dL  VLDL Cholesterol Cal 41 (H) 5 - 40 mg/dL   LDL Chol Calc (NIH) 115 (H) 0 - 99 mg/dL   Chol/HDL Ratio 6.0 (H) 0.0 - 4.4 ratio    Comment:                                   T. Chol/HDL Ratio                                             Men  Women                               1/2 Avg.Risk  3.4    3.3                                    Avg.Risk  5.0    4.4                                2X Avg.Risk  9.6    7.1                                3X Avg.Risk 23.4   11.0   VITAMIN D 25 Hydroxy (Vit-D Deficiency, Fractures)     Status: Abnormal   Collection Time: 09/18/22 11:57 AM  Result Value Ref Range   Vit D, 25-Hydroxy 28.5 (L) 30.0 - 100.0 ng/mL    Comment: Vitamin D deficiency has been defined by the [place] practice guideline as a level of serum 25-OH vitamin D less than 20 ng/mL (1,2). The Endocrine Society went on to further define vitamin D insufficiency as a level between 21 and 29 ng/mL (2). 1. IOM (Institute of Medicine). 2010. Dietary reference    intakes for calcium and D. [place]: The    [employer]. 2. Holick MF, Binkley [place], Bischoff-Ferrari HA, et al.    Evaluation, treatment, and prevention of vitamin D    deficiency: an Endocrine Society clinical practice    guideline. JCEM. 2011 Jul; 96(7):1911-30.   CBC With Differential     Status: None   Collection Time: 09/18/22 11:57 AM  Result Value Ref Range   WBC 7.6 3.4 - 10.8 x10E3/uL   RBC 4.78 3.77 - 5.28 x10E6/uL   Hemoglobin 14.2 11.1 - 15.9 g/dL   Hematocrit 44.5 34.0 - 46.6 %   MCV 93 79 - 97 fL   MCH 29.7 26.6 - 33.0 pg   MCHC 31.9 31.5 - 35.7 g/dL   RDW 13.3 11.7 - 15.4 %   Neutrophils 55 Not Estab. %   Lymphs 35 Not Estab. %   Monocytes 7 Not Estab. %   Eos 2 Not Estab. %   Basos 1 Not Estab. %   Neutrophils Absolute 4.1 1.4 - 7.0 x10E3/uL   Lymphocytes Absolute 2.6 0.7 - 3.1 x10E3/uL   Monocytes Absolute 0.6 0.1 - 0.9 x10E3/uL   EOS (ABSOLUTE) 0.1 0.0 - 0.4 x10E3/uL   Basophils Absolute 0.1 0.0 - 0.2 x10E3/uL   Immature Granulocytes 0  Not Estab. %   Immature Grans (Abs) 0.0 0.0 - 0.1 x10E3/uL  CMP14+EGFR     Status: Abnormal   Collection Time: 09/18/22 11:57 AM  Result Value Ref Range   Glucose 120 (H) 70 - 99 mg/dL   BUN 9 6 - 24 mg/dL   Creatinine, Ser  0.73 0.57 - 1.00 mg/dL   eGFR 106 >59 mL/min/1.73   BUN/Creatinine Ratio 12 9 - 23   Sodium 140 134 - 144 mmol/L   Potassium 4.6 3.5 - 5.2 mmol/L   Chloride 101 96 - 106 mmol/L   CO2 23 20 - 29 mmol/L   Calcium 9.6 8.7 - 10.2 mg/dL   Total Protein 7.2 6.0 - 8.5 g/dL   Albumin 4.1 3.9 - 4.9 g/dL   Globulin, Total 3.1 1.5 - 4.5 g/dL   Albumin/Globulin Ratio 1.3 1.2 - 2.2   Bilirubin Total 0.4 0.0 - 1.2 mg/dL   Alkaline Phosphatase 84 44 - 121 IU/L   AST 19 0 - 40 IU/L   ALT 23 0 - 32 IU/L  TSH     Status: None   Collection Time: 09/18/22 11:57 AM  Result Value Ref Range   TSH 1.750 0.450 - 4.500 uIU/mL  Hemoglobin A1c     Status: Abnormal   Collection Time: 09/18/22 11:57 AM  Result Value Ref Range   Hgb A1c MFr Bld 6.4 (H) 4.8 - 5.6 %    Comment:          Prediabetes: 5.7 - 6.4          Diabetes: >6.4          Glycemic control for adults with diabetes: <7.0    Est. average glucose Bld gHb Est-mCnc 137 mg/dL  Vitamin B12     Status: None   Collection Time: 09/18/22 11:57 AM  Result Value Ref Range   Vitamin B-12 688 232 - 1,245 pg/mL      Assessment & Plan:   Problem List Items Addressed This Visit     Type 2 diabetes mellitus with hyperglycemia ([place]) - Primary    Patient using Mounjaro and is tolerating well. Recently sent refills for her medications.   Checking labs today - will adjust as needed  Also sending orders for a freestyle libre, patient needs more steady monitoring, and she has not been able to manage her accuchek without difficulty.        Relevant Medications   JARDIANCE 10 MG TABS tablet   lisinopril (ZESTRIL) 2.5 MG tablet   Other Relevant Orders   POCT CBG (Fasting - Glucose) (Completed)   CBC With Differential (Completed)   CMP14+EGFR (Completed)   Hemoglobin A1c (Completed)   Depression    Continue lexapro RX.  Patient well managed on current dosing.       Relevant Medications   escitalopram (LEXAPRO) 20 MG tablet   Other Visit  Diagnoses     B12 deficiency due to diet       Relevant Orders   CBC With Differential (Completed)   CMP14+EGFR (Completed)   Vitamin B12 (Completed)   Vitamin D deficiency, unspecified       Relevant Orders   VITAMIN D 25 Hydroxy (Vit-D Deficiency, Fractures) (Completed)   CBC With Differential (Completed)   CMP14+EGFR (Completed)   Mixed hyperlipidemia       Relevant Medications   lisinopril (ZESTRIL) 2.5 MG tablet   Other Relevant Orders   Lipid panel (Completed)   CBC With Differential (Completed)   CMP14+EGFR (  Completed)   Essential hypertension, benign       Relevant Medications   lisinopril (ZESTRIL) 2.5 MG tablet   Other Relevant Orders   CBC With Differential (Completed)   CMP14+EGFR (Completed)   Hypothyroidism (acquired)       Relevant Orders   CBC With Differential (Completed)   CMP14+EGFR (Completed)   TSH (Completed)       Return in about 3 months (around 12/19/2022) for F/U.   Total time spent: 20 minutes  Mechele Claude, FNP  09/18/2022

## 2022-09-19 ENCOUNTER — Encounter: Payer: Self-pay | Admitting: Family

## 2022-09-19 DIAGNOSIS — N911 Secondary amenorrhea: Secondary | ICD-10-CM

## 2022-09-19 DIAGNOSIS — F32A Depression, unspecified: Secondary | ICD-10-CM

## 2022-09-19 DIAGNOSIS — E1165 Type 2 diabetes mellitus with hyperglycemia: Secondary | ICD-10-CM

## 2022-09-19 HISTORY — DX: Depression, unspecified: F32.A

## 2022-09-19 HISTORY — DX: Secondary amenorrhea: N91.1

## 2022-09-19 HISTORY — DX: Type 2 diabetes mellitus with hyperglycemia: E11.65

## 2022-09-19 LAB — CBC WITH DIFFERENTIAL
Basophils Absolute: 0.1 10*3/uL (ref 0.0–0.2)
Basos: 1 %
EOS (ABSOLUTE): 0.1 10*3/uL (ref 0.0–0.4)
Eos: 2 %
Hematocrit: 44.5 % (ref 34.0–46.6)
Hemoglobin: 14.2 g/dL (ref 11.1–15.9)
Immature Grans (Abs): 0 10*3/uL (ref 0.0–0.1)
Immature Granulocytes: 0 %
Lymphocytes Absolute: 2.6 10*3/uL (ref 0.7–3.1)
Lymphs: 35 %
MCH: 29.7 pg (ref 26.6–33.0)
MCHC: 31.9 g/dL (ref 31.5–35.7)
MCV: 93 fL (ref 79–97)
Monocytes Absolute: 0.6 10*3/uL (ref 0.1–0.9)
Monocytes: 7 %
Neutrophils Absolute: 4.1 10*3/uL (ref 1.4–7.0)
Neutrophils: 55 %
RBC: 4.78 x10E6/uL (ref 3.77–5.28)
RDW: 13.3 % (ref 11.7–15.4)
WBC: 7.6 10*3/uL (ref 3.4–10.8)

## 2022-09-19 LAB — CMP14+EGFR
ALT: 23 IU/L (ref 0–32)
AST: 19 IU/L (ref 0–40)
Albumin/Globulin Ratio: 1.3 (ref 1.2–2.2)
Albumin: 4.1 g/dL (ref 3.9–4.9)
Alkaline Phosphatase: 84 IU/L (ref 44–121)
BUN/Creatinine Ratio: 12 (ref 9–23)
BUN: 9 mg/dL (ref 6–24)
Bilirubin Total: 0.4 mg/dL (ref 0.0–1.2)
CO2: 23 mmol/L (ref 20–29)
Calcium: 9.6 mg/dL (ref 8.7–10.2)
Chloride: 101 mmol/L (ref 96–106)
Creatinine, Ser: 0.73 mg/dL (ref 0.57–1.00)
Globulin, Total: 3.1 g/dL (ref 1.5–4.5)
Glucose: 120 mg/dL — ABNORMAL HIGH (ref 70–99)
Potassium: 4.6 mmol/L (ref 3.5–5.2)
Sodium: 140 mmol/L (ref 134–144)
Total Protein: 7.2 g/dL (ref 6.0–8.5)
eGFR: 106 mL/min/{1.73_m2} (ref >59)

## 2022-09-19 LAB — VITAMIN B12: Vitamin B-12: 688 pg/mL (ref 232–1245)

## 2022-09-19 LAB — LIPID PANEL
Chol/HDL Ratio: 6 ratio — ABNORMAL HIGH (ref 0.0–4.4)
Cholesterol, Total: 187 mg/dL (ref 100–199)
HDL: 31 mg/dL — ABNORMAL LOW (ref >39)
LDL Chol Calc (NIH): 115 mg/dL — ABNORMAL HIGH (ref 0–99)
Triglycerides: 232 mg/dL — ABNORMAL HIGH (ref 0–149)
VLDL Cholesterol Cal: 41 mg/dL — ABNORMAL HIGH (ref 5–40)

## 2022-09-19 LAB — TSH: TSH: 1.75 u[IU]/mL (ref 0.450–4.500)

## 2022-09-19 LAB — HEMOGLOBIN A1C
Est. average glucose Bld gHb Est-mCnc: 137 mg/dL
Hgb A1c MFr Bld: 6.4 % — ABNORMAL HIGH (ref 4.8–5.6)

## 2022-09-19 LAB — VITAMIN D 25 HYDROXY (VIT D DEFICIENCY, FRACTURES): Vit D, 25-Hydroxy: 28.5 ng/mL — ABNORMAL LOW (ref 30.0–100.0)

## 2022-09-19 NOTE — Assessment & Plan Note (Signed)
Patient using Mounjaro and is tolerating well. Recently sent refills for her medications.   Checking labs today - will adjust as needed  Also sending orders for a freestyle libre, patient needs more steady monitoring, and she has not been able to manage her accuchek without difficulty.

## 2022-09-19 NOTE — Assessment & Plan Note (Signed)
Continue lexapro RX.  Patient well managed on current dosing.

## 2022-09-22 ENCOUNTER — Telehealth: Payer: Self-pay

## 2022-09-22 NOTE — Telephone Encounter (Signed)
Pt called and left vm regarding mounjaro rx being on back order & asked about alternate rx/if you wanted her to go back on ozempic? Please advise

## 2022-09-25 ENCOUNTER — Ambulatory Visit: Payer: Medicare HMO | Admitting: Family

## 2022-10-01 ENCOUNTER — Other Ambulatory Visit: Payer: Self-pay | Admitting: Family

## 2022-10-02 NOTE — Telephone Encounter (Signed)
Patient called and needs her escitalopram refilled at [facility]. Needs Mounjaro sent to [facility].

## 2022-10-16 ENCOUNTER — Ambulatory Visit
Admission: RE | Admit: 2022-10-16 | Discharge: 2022-10-16 | Disposition: A | Discharge status: Home / Self Care | Payer: Medicare HMO | Source: Ambulatory Visit | Attending: Family | Admitting: Family

## 2022-10-16 DIAGNOSIS — Z1231 Encounter for screening mammogram for malignant neoplasm of breast: Secondary | ICD-10-CM | POA: Diagnosis not present

## 2022-12-14 ENCOUNTER — Ambulatory Visit (INDEPENDENT_AMBULATORY_CARE_PROVIDER_SITE_OTHER): Payer: Medicare HMO | Admitting: Family

## 2022-12-14 ENCOUNTER — Encounter: Payer: Self-pay | Admitting: Family

## 2022-12-14 VITALS — BP 132/84 | HR 90 | Ht 64.0 in | Wt 211.6 lb

## 2022-12-14 DIAGNOSIS — E1165 Type 2 diabetes mellitus with hyperglycemia: Secondary | ICD-10-CM

## 2022-12-14 DIAGNOSIS — F331 Major depressive disorder, recurrent, moderate: Secondary | ICD-10-CM | POA: Diagnosis not present

## 2022-12-14 LAB — POCT CBG (FASTING - GLUCOSE)-MANUAL ENTRY: Glucose Fasting, POC: 113 mg/dL — AB (ref 70–99)

## 2022-12-14 MED ORDER — SEMAGLUTIDE(0.25 OR 0.5MG/DOS) 2 MG/3ML ~~LOC~~ SOPN: 0.5000 mg | PEN_INJECTOR | SUBCUTANEOUS | 0 refills | Status: DC

## 2022-12-14 MED ORDER — ESCITALOPRAM OXALATE 20 MG PO TABS: 20.0000 mg | ORAL_TABLET | Freq: Every day | ORAL | 3 refills | Status: AC

## 2022-12-14 MED ORDER — LISINOPRIL 2.5 MG PO TABS: 2.5000 mg | ORAL_TABLET | Freq: Every day | ORAL | 1 refills | Status: AC

## 2022-12-14 MED ORDER — LISINOPRIL 2.5 MG PO TABS: 2.5000 mg | ORAL_TABLET | Freq: Every day | ORAL | 1 refills | Status: DC

## 2022-12-14 MED ORDER — JARDIANCE 10 MG PO TABS: 10.0000 mg | ORAL_TABLET | Freq: Every day | ORAL | 3 refills | Status: DC

## 2022-12-15 ENCOUNTER — Other Ambulatory Visit: Payer: Self-pay | Admitting: Family

## 2022-12-18 ENCOUNTER — Ambulatory Visit: Payer: Medicare HMO | Admitting: Family

## 2022-12-22 ENCOUNTER — Other Ambulatory Visit: Payer: Self-pay

## 2022-12-23 MED ORDER — SEMAGLUTIDE(0.25 OR 0.5MG/DOS) 2 MG/3ML ~~LOC~~ SOPN: 0.5000 mg | PEN_INJECTOR | SUBCUTANEOUS | 0 refills | Status: AC

## 2022-12-26 ENCOUNTER — Encounter: Payer: Self-pay | Admitting: Family

## 2022-12-26 NOTE — Assessment & Plan Note (Addendum)
Increase Ozempic Dose.  Continue current diabetes POC, as patient has been well controlled on current regimen.  Will adjust meds if needed based on labs.

## 2022-12-26 NOTE — Assessment & Plan Note (Signed)
Adding Vraylar to meds.  Continue lexapro.   Will recheck at follow up in 2 weeks.

## 2022-12-26 NOTE — Progress Notes (Signed)
Established Patient Office Visit  Subjective:  Patient ID: Doris Coleman, female    DOB: 07-01-1980  Age: 42 y.o. MRN: [id number]  Chief Complaint  Patient presents with   Follow-up    Needs diabetic meds, having mood swings.    Patient is here for follow up appointment.  She needs refills on several of her meds, but she is also having an increase in irritability and anxiety symptoms.  Her mom says that she has been very snippy with her and other family members of the past several weeks, and she agrees that this has been the case.   She is currently taking the lexapro, but that is all.     No other concerns at this time.   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 No pertinent surgical history.  Social History   Socioeconomic History   Marital status: Single    Spouse name: Not on file   Number of children: Not on file   Years of education: Not on file   Highest education level: Not on file  Occupational History   Not on file  Tobacco Use   Smoking status: Never   Smokeless tobacco: Never  Substance and Sexual Activity   Alcohol use: Never   Drug use: Never   Sexual activity: Not on file  Other Topics Concern   Not on file  Social History Narrative   Not on file   Social Determinants of Health   Financial Resource Strain: Not on file  Food Insecurity: Not on file  Transportation Needs: Not on file  Physical Activity: Not on file  Stress: Not on file  Social Connections: Not on file  Intimate Partner Violence: Not on file    Family History  Problem Relation Age of Onset   Hyperlipidemia Mother    Hypertension Mother    Breast cancer Maternal Aunt    Breast cancer Paternal Aunt     Allergies  Allergen Reactions   Latex Hives and Rash    Review of Systems  Psychiatric/Behavioral:  Positive for depression.  The patient is nervous/anxious.   All other systems reviewed and are negative.      Objective:   BP 132/84   Pulse 90   Ht 5\' 4"  (1.626 m)   Wt 211 lb 9.6 oz (96 kg)   SpO2 97%   BMI 36.32 kg/m   Vitals:   12/14/22 1355  BP: 132/84  Pulse: 90  Height: 5\' 4"  (1.626 m)  Weight: 211 lb 9.6 oz (96 kg)  SpO2: 97%  BMI (Calculated): 36.3    Physical Exam Vitals and nursing note reviewed.  Constitutional:      Appearance: Normal appearance. She is normal weight.  HENT:     Head: Normocephalic.  Eyes:     Extraocular Movements: Extraocular movements intact.     Conjunctiva/sclera: Conjunctivae normal.     Pupils: Pupils are equal, round, and reactive to light.  Cardiovascular:     Rate and Rhythm: Normal rate.  Pulmonary:     Effort: Pulmonary effort is normal.  Neurological:     General: No focal deficit present.     Mental Status: She is alert and oriented to person, place, and time. Mental status is at baseline.  Psychiatric:        Attention and Perception: Attention and perception normal.  Mood and Affect: Mood is anxious.        Speech: Speech normal.        Behavior: Behavior is agitated. Behavior is cooperative.        Thought Content: Thought content normal.        Cognition and Memory: Cognition and memory normal.        Judgment: Judgment normal.     Comments: Irritable      Results for orders placed or performed in visit on 12/14/22  POCT CBG (Fasting - Glucose)  Result Value Ref Range   Glucose Fasting, POC 113 (A) 70 - 99 mg/dL    Recent Results (from the past 2160 hour(s))  POCT CBG (Fasting - Glucose)     Status: Abnormal   Collection Time: 12/14/22  2:02 PM  Result Value Ref Range   Glucose Fasting, POC 113 (A) 70 - 99 mg/dL       Assessment & Plan:   Problem List Items Addressed This Visit       Active Problems   Type 2 diabetes mellitus with hyperglycemia ([facility]) - Primary    Increase Ozempic Dose.  Continue current  diabetes POC, as patient has been well controlled on current regimen.  Will adjust meds if needed based on labs.       Relevant Medications   lisinopril (ZESTRIL) 2.5 MG tablet   Other Relevant Orders   POCT CBG (Fasting - Glucose) (Completed)   Depression    Adding Vraylar to meds.  Continue lexapro.   Will recheck at follow up in 2 weeks.       Relevant Medications   escitalopram (LEXAPRO) 20 MG tablet    Return in about 2 weeks (around 12/28/2022).   Total time spent: 30 minutes  Miki Kins, FNP  12/14/2022   This document may have been prepared by [facility] Voice Recognition software and as such may include unintentional dictation errors.

## 2022-12-28 ENCOUNTER — Ambulatory Visit: Payer: Medicare HMO | Admitting: Family

## 2023-03-16 ENCOUNTER — Ambulatory Visit: Payer: Medicare HMO | Admitting: Family

## 2023-03-29 DIAGNOSIS — Z1330 Encounter for screening examination for mental health and behavioral disorders, unspecified: Secondary | ICD-10-CM | POA: Diagnosis not present

## 2023-03-29 DIAGNOSIS — Z1331 Encounter for screening for depression: Secondary | ICD-10-CM | POA: Diagnosis not present

## 2023-03-29 DIAGNOSIS — E781 Pure hyperglyceridemia: Secondary | ICD-10-CM | POA: Diagnosis not present

## 2023-03-29 DIAGNOSIS — I10 Essential (primary) hypertension: Secondary | ICD-10-CM | POA: Diagnosis not present

## 2023-03-29 DIAGNOSIS — Z23 Encounter for immunization: Secondary | ICD-10-CM | POA: Diagnosis not present

## 2023-03-29 DIAGNOSIS — E1165 Type 2 diabetes mellitus with hyperglycemia: Secondary | ICD-10-CM | POA: Diagnosis not present

## 2023-04-07 DIAGNOSIS — F3181 Bipolar II disorder: Secondary | ICD-10-CM | POA: Diagnosis not present

## 2023-04-07 DIAGNOSIS — F845 Asperger's syndrome: Secondary | ICD-10-CM | POA: Diagnosis not present

## 2023-04-15 DIAGNOSIS — F845 Asperger's syndrome: Secondary | ICD-10-CM | POA: Diagnosis not present

## 2023-04-15 DIAGNOSIS — F3181 Bipolar II disorder: Secondary | ICD-10-CM | POA: Diagnosis not present

## 2023-06-29 DIAGNOSIS — F411 Generalized anxiety disorder: Secondary | ICD-10-CM | POA: Diagnosis not present

## 2023-06-29 DIAGNOSIS — E781 Pure hyperglyceridemia: Secondary | ICD-10-CM | POA: Diagnosis not present

## 2023-06-29 DIAGNOSIS — E1165 Type 2 diabetes mellitus with hyperglycemia: Secondary | ICD-10-CM | POA: Diagnosis not present

## 2023-06-29 DIAGNOSIS — Z6837 Body mass index (BMI) 37.0-37.9, adult: Secondary | ICD-10-CM | POA: Diagnosis not present

## 2023-06-29 DIAGNOSIS — F840 Autistic disorder: Secondary | ICD-10-CM | POA: Diagnosis not present

## 2023-06-29 DIAGNOSIS — Z1330 Encounter for screening examination for mental health and behavioral disorders, unspecified: Secondary | ICD-10-CM | POA: Diagnosis not present

## 2023-06-29 DIAGNOSIS — E1121 Type 2 diabetes mellitus with diabetic nephropathy: Secondary | ICD-10-CM | POA: Diagnosis not present

## 2023-06-29 DIAGNOSIS — F331 Major depressive disorder, recurrent, moderate: Secondary | ICD-10-CM | POA: Diagnosis not present

## 2023-09-21 ENCOUNTER — Other Ambulatory Visit: Payer: Self-pay | Admitting: Family

## 2023-10-02 DIAGNOSIS — M549 Dorsalgia, unspecified: Secondary | ICD-10-CM | POA: Diagnosis not present

## 2023-10-05 DIAGNOSIS — M545 Low back pain, unspecified: Secondary | ICD-10-CM | POA: Diagnosis not present

## 2023-10-05 DIAGNOSIS — E1165 Type 2 diabetes mellitus with hyperglycemia: Secondary | ICD-10-CM | POA: Diagnosis not present

## 2023-10-05 DIAGNOSIS — E11319 Type 2 diabetes mellitus with unspecified diabetic retinopathy without macular edema: Secondary | ICD-10-CM | POA: Diagnosis not present

## 2023-10-05 DIAGNOSIS — F331 Major depressive disorder, recurrent, moderate: Secondary | ICD-10-CM | POA: Diagnosis not present

## 2023-10-05 DIAGNOSIS — E1121 Type 2 diabetes mellitus with diabetic nephropathy: Secondary | ICD-10-CM | POA: Diagnosis not present

## 2023-10-05 DIAGNOSIS — Z6836 Body mass index (BMI) 36.0-36.9, adult: Secondary | ICD-10-CM | POA: Diagnosis not present

## 2023-10-05 DIAGNOSIS — I10 Essential (primary) hypertension: Secondary | ICD-10-CM | POA: Diagnosis not present

## 2023-10-05 DIAGNOSIS — E1140 Type 2 diabetes mellitus with diabetic neuropathy, unspecified: Secondary | ICD-10-CM | POA: Diagnosis not present

## 2023-10-28 ENCOUNTER — Other Ambulatory Visit: Payer: Self-pay

## 2023-11-04 ENCOUNTER — Other Ambulatory Visit: Payer: Self-pay | Admitting: Family

## 2023-12-08 DIAGNOSIS — I10 Essential (primary) hypertension: Secondary | ICD-10-CM | POA: Diagnosis not present

## 2023-12-08 DIAGNOSIS — E119 Type 2 diabetes mellitus without complications: Secondary | ICD-10-CM | POA: Diagnosis not present

## 2023-12-08 DIAGNOSIS — E782 Mixed hyperlipidemia: Secondary | ICD-10-CM | POA: Diagnosis not present

## 2023-12-08 DIAGNOSIS — E66811 Obesity, class 1: Secondary | ICD-10-CM | POA: Diagnosis not present

## 2023-12-08 DIAGNOSIS — Z1231 Encounter for screening mammogram for malignant neoplasm of breast: Secondary | ICD-10-CM | POA: Diagnosis not present

## 2023-12-08 DIAGNOSIS — Z87898 Personal history of other specified conditions: Secondary | ICD-10-CM | POA: Diagnosis not present

## 2023-12-08 DIAGNOSIS — F418 Other specified anxiety disorders: Secondary | ICD-10-CM | POA: Diagnosis not present

## 2023-12-20 DIAGNOSIS — Z1231 Encounter for screening mammogram for malignant neoplasm of breast: Secondary | ICD-10-CM | POA: Diagnosis not present

## 2023-12-23 DIAGNOSIS — I10 Essential (primary) hypertension: Secondary | ICD-10-CM | POA: Diagnosis not present

## 2023-12-23 DIAGNOSIS — E66811 Obesity, class 1: Secondary | ICD-10-CM | POA: Diagnosis not present

## 2023-12-23 DIAGNOSIS — E782 Mixed hyperlipidemia: Secondary | ICD-10-CM | POA: Diagnosis not present

## 2023-12-23 DIAGNOSIS — Z0000 Encounter for general adult medical examination without abnormal findings: Secondary | ICD-10-CM | POA: Diagnosis not present

## 2023-12-23 DIAGNOSIS — E119 Type 2 diabetes mellitus without complications: Secondary | ICD-10-CM | POA: Diagnosis not present

## 2023-12-23 DIAGNOSIS — Z124 Encounter for screening for malignant neoplasm of cervix: Secondary | ICD-10-CM | POA: Diagnosis not present

## 2023-12-23 DIAGNOSIS — Z7689 Persons encountering health services in other specified circumstances: Secondary | ICD-10-CM | POA: Diagnosis not present

## 2024-03-22 DIAGNOSIS — I10 Essential (primary) hypertension: Secondary | ICD-10-CM | POA: Diagnosis not present

## 2024-03-22 DIAGNOSIS — E119 Type 2 diabetes mellitus without complications: Secondary | ICD-10-CM | POA: Diagnosis not present

## 2024-03-22 DIAGNOSIS — F418 Other specified anxiety disorders: Secondary | ICD-10-CM | POA: Diagnosis not present

## 2024-03-22 DIAGNOSIS — E782 Mixed hyperlipidemia: Secondary | ICD-10-CM | POA: Diagnosis not present

## 2024-03-22 DIAGNOSIS — Z23 Encounter for immunization: Secondary | ICD-10-CM | POA: Diagnosis not present

## 2024-03-30 DIAGNOSIS — E119 Type 2 diabetes mellitus without complications: Secondary | ICD-10-CM | POA: Diagnosis not present

## 2024-03-30 DIAGNOSIS — Z833 Family history of diabetes mellitus: Secondary | ICD-10-CM | POA: Diagnosis not present

## 2024-03-30 DIAGNOSIS — M1990 Unspecified osteoarthritis, unspecified site: Secondary | ICD-10-CM | POA: Diagnosis not present

## 2024-03-30 DIAGNOSIS — K219 Gastro-esophageal reflux disease without esophagitis: Secondary | ICD-10-CM | POA: Diagnosis not present

## 2024-03-30 DIAGNOSIS — E785 Hyperlipidemia, unspecified: Secondary | ICD-10-CM | POA: Diagnosis not present

## 2024-03-30 DIAGNOSIS — Z7984 Long term (current) use of oral hypoglycemic drugs: Secondary | ICD-10-CM | POA: Diagnosis not present

## 2024-03-30 DIAGNOSIS — F325 Major depressive disorder, single episode, in full remission: Secondary | ICD-10-CM | POA: Diagnosis not present

## 2024-03-30 DIAGNOSIS — I10 Essential (primary) hypertension: Secondary | ICD-10-CM | POA: Diagnosis not present

## 2024-03-30 DIAGNOSIS — F419 Anxiety disorder, unspecified: Secondary | ICD-10-CM | POA: Diagnosis not present

## 2024-04-19 DIAGNOSIS — H5213 Myopia, bilateral: Secondary | ICD-10-CM | POA: Diagnosis not present

## 2024-05-17 DIAGNOSIS — M549 Dorsalgia, unspecified: Secondary | ICD-10-CM | POA: Diagnosis not present

## 2024-05-17 DIAGNOSIS — I10 Essential (primary) hypertension: Secondary | ICD-10-CM | POA: Diagnosis not present

## 2024-05-17 DIAGNOSIS — E119 Type 2 diabetes mellitus without complications: Secondary | ICD-10-CM | POA: Diagnosis not present

## 2024-05-23 DIAGNOSIS — M4003 Postural kyphosis, cervicothoracic region: Secondary | ICD-10-CM | POA: Diagnosis not present

## 2024-05-23 DIAGNOSIS — G8929 Other chronic pain: Secondary | ICD-10-CM | POA: Diagnosis not present

## 2024-05-23 DIAGNOSIS — M542 Cervicalgia: Secondary | ICD-10-CM | POA: Diagnosis not present

## 2024-05-23 DIAGNOSIS — M546 Pain in thoracic spine: Secondary | ICD-10-CM | POA: Diagnosis not present
